# Patient Record
Sex: Female | Born: 2000 | Race: Black or African American | Hispanic: No | Marital: Single | Smoking: Never smoker
Health system: Southern US, Community
[De-identification: ages and names within clinical notes are randomized; demographics above are authoritative.]

## PROBLEM LIST (undated history)

## (undated) DIAGNOSIS — S060X9A Concussion with loss of consciousness of unspecified duration, initial encounter: Secondary | ICD-10-CM

## (undated) DIAGNOSIS — S060XAA Concussion with loss of consciousness status unknown, initial encounter: Secondary | ICD-10-CM

## (undated) DIAGNOSIS — L309 Dermatitis, unspecified: Secondary | ICD-10-CM

---
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
## 2023-04-11 ENCOUNTER — Other Ambulatory Visit: Payer: Self-pay

## 2023-04-11 ENCOUNTER — Emergency Department (HOSPITAL_COMMUNITY)
Admission: EM | Admit: 2023-04-11 | Discharge: 2023-04-12 | Discharge status: Against Medical Advice | Payer: Medicaid Other | Attending: Emergency Medicine | Admitting: Emergency Medicine

## 2023-04-11 ENCOUNTER — Encounter (HOSPITAL_COMMUNITY): Payer: Self-pay

## 2023-04-11 ENCOUNTER — Emergency Department (HOSPITAL_COMMUNITY): Payer: Medicaid Other

## 2023-04-11 DIAGNOSIS — Z5321 Procedure and treatment not carried out due to patient leaving prior to being seen by health care provider: Secondary | ICD-10-CM | POA: Insufficient documentation

## 2023-04-11 DIAGNOSIS — J45909 Unspecified asthma, uncomplicated: Secondary | ICD-10-CM | POA: Diagnosis not present

## 2023-04-11 DIAGNOSIS — R059 Cough, unspecified: Secondary | ICD-10-CM | POA: Diagnosis not present

## 2023-04-11 DIAGNOSIS — R0789 Other chest pain: Secondary | ICD-10-CM | POA: Diagnosis present

## 2023-04-11 LAB — CBC
HCT: 43.5 % (ref 36.0–46.0)
Hemoglobin: 15.1 g/dL — ABNORMAL HIGH (ref 12.0–15.0)
MCH: 30.3 pg (ref 26.0–34.0)
MCHC: 34.7 g/dL (ref 30.0–36.0)
MCV: 87.2 fL (ref 80.0–100.0)
Platelets: 372 10*3/uL (ref 150–400)
RBC: 4.99 MIL/uL (ref 3.87–5.11)
RDW: 12 % (ref 11.5–15.5)
WBC: 15.9 10*3/uL — ABNORMAL HIGH (ref 4.0–10.5)
nRBC: 0 % (ref 0.0–0.2)

## 2023-04-11 LAB — BASIC METABOLIC PANEL
Anion gap: 11 (ref 5–15)
BUN: 8 mg/dL (ref 6–20)
CO2: 18 mmol/L — ABNORMAL LOW (ref 22–32)
Calcium: 9.7 mg/dL (ref 8.9–10.3)
Chloride: 107 mmol/L (ref 98–111)
Creatinine, Ser: 0.77 mg/dL (ref 0.44–1.00)
GFR, Estimated: >60 mL/min (ref >60)
Glucose, Bld: 124 mg/dL — ABNORMAL HIGH (ref 70–99)
Potassium: 3.7 mmol/L (ref 3.5–5.1)
Sodium: 136 mmol/L (ref 135–145)

## 2023-04-11 LAB — HCG, QUANTITATIVE, PREGNANCY: hCG, Beta Chain, Quant, S: <1 m[IU]/mL (ref <5)

## 2023-04-11 LAB — TROPONIN I (HIGH SENSITIVITY): Troponin I (High Sensitivity): 3 ng/L (ref <18)

## 2023-04-11 MED ORDER — IPRATROPIUM-ALBUTEROL 0.5-2.5 (3) MG/3ML IN SOLN
RESPIRATORY_TRACT | Status: AC
  Filled 2023-04-11: qty 3

## 2023-04-11 MED ORDER — IPRATROPIUM-ALBUTEROL 0.5-2.5 (3) MG/3ML IN SOLN
3.0000 mL | Freq: Once | RESPIRATORY_TRACT | Status: AC
  Administered 2023-04-11: 3 mL via RESPIRATORY_TRACT

## 2023-04-11 MED ORDER — ALBUTEROL SULFATE HFA 108 (90 BASE) MCG/ACT IN AERS
2.0000 | INHALATION_SPRAY | RESPIRATORY_TRACT | Status: DC | PRN
  Filled 2023-04-11: qty 6.7

## 2023-04-11 NOTE — ED Triage Notes (Signed)
Pt reports she has felt sick for the past week, unrelieved with OTC meds. Hx of asthma and felt like she had an asthma attack tonight and had 3 at home breathing treatments but she states it did not help much. She reports chest tightness and cough of green phlegm. She is speaking clear complete sentences.

## 2023-04-12 LAB — TROPONIN I (HIGH SENSITIVITY): Troponin I (High Sensitivity): 3 ng/L (ref <18)

## 2023-04-12 NOTE — ED Notes (Signed)
 Pt decided to leave before going to a room.
